# Patient Record
Sex: Male | Born: 1980 | Hispanic: No | Marital: Married | Smoking: Never smoker
Health system: Southern US, Community
[De-identification: ages and names within clinical notes are randomized; demographics above are authoritative.]

## PROBLEM LIST (undated history)

## (undated) HISTORY — PX: TONSILLECTOMY: SUR1361

---

## 2014-09-02 ENCOUNTER — Emergency Department (HOSPITAL_COMMUNITY)
Admission: EM | Admit: 2014-09-02 | Discharge: 2014-09-03 | Disposition: A | Discharge status: Home / Self Care | Payer: Self-pay | Attending: Emergency Medicine | Admitting: Emergency Medicine

## 2014-09-02 ENCOUNTER — Encounter (HOSPITAL_COMMUNITY): Payer: Self-pay | Admitting: Emergency Medicine

## 2014-09-02 DIAGNOSIS — R52 Pain, unspecified: Secondary | ICD-10-CM | POA: Insufficient documentation

## 2014-09-02 DIAGNOSIS — M542 Cervicalgia: Secondary | ICD-10-CM | POA: Insufficient documentation

## 2014-09-02 DIAGNOSIS — G8929 Other chronic pain: Secondary | ICD-10-CM | POA: Insufficient documentation

## 2014-09-02 DIAGNOSIS — Z87828 Personal history of other (healed) physical injury and trauma: Secondary | ICD-10-CM | POA: Insufficient documentation

## 2014-09-02 NOTE — ED Notes (Signed)
Pt states he just moved here recently and today he was moving boxes and felt a pop in his neck  Pt states he took a muscle relaxant and a cold shower but it did not help  Pt states it hurts to move

## 2014-09-03 ENCOUNTER — Emergency Department (HOSPITAL_COMMUNITY): Payer: Self-pay

## 2014-09-03 MED ORDER — KETOROLAC TROMETHAMINE 60 MG/2ML IM SOLN
60.0000 mg | Freq: Once | INTRAMUSCULAR | Status: AC
  Administered 2014-09-03: 60 mg via INTRAMUSCULAR
  Filled 2014-09-03: qty 2

## 2014-09-03 MED ORDER — OXYCODONE-ACETAMINOPHEN 5-325 MG PO TABS
2.0000 | ORAL_TABLET | Freq: Once | ORAL | Status: DC
  Filled 2014-09-03: qty 2

## 2014-09-03 MED ORDER — OXYCODONE-ACETAMINOPHEN 5-325 MG PO TABS: 1.0000 | ORAL_TABLET | ORAL | Status: AC | PRN

## 2014-09-03 MED ORDER — NAPROXEN 500 MG PO TABS: 500.0000 mg | ORAL_TABLET | Freq: Two times a day (BID) | ORAL | Status: AC
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
## 2014-09-03 NOTE — Discharge Instructions (Signed)
Pain medications as prescribed. Follow up with neurosurgery or primary care doctor for further treatment. Return if any numbness or weakness in extremities.   Herniated Disk A herniated disk occurs when a disk in your spine bulges out too far. This condition is also called a ruptured disk or slipped disk. Your spine (backbone) is made up of bones called vertebrae. Between each pair of vertebrae is an oval disk with a soft, spongy center that acts as a shock absorber when you move. The spongy center is surrounded by a tough outer ring. When you have a herniated disk, the spongy center of the disk bulges out or ruptures through the outer ring. A herniated disk can press on a nerve between your vertebrae and cause pain. A herniated disk can occur anywhere in your back or neck area, but the lower back is the most common spot. CAUSES  In many cases, a herniated disk occurs just from getting older. As you age, the spongy insides of your disks tend to shrink and dry out. A herniated disk can result from gradual wear and tear. Injury or sudden strain can also cause a herniated disk.  RISK FACTORS Aging is the main risk factor for a herniated disk. Other risk factors include:  Being a man between the ages of 22 and 69 years.  Having a job that requires heavy lifting, bending, or twisting.  Having a job that requires long hours of driving.  Not getting enough exercise.  Being overweight.  Smoking. SIGNS AND SYMPTOMS  Signs and symptoms depend on which disk is herniated.  For a herniated disk in the lower back, you may have sharp pain in:  One part of your leg, hip, or buttocks.  The back of your calf.  The top or sole of your foot (sciatica).   For a herniated disk in the neck, you may feel pain:  When you move your neck.  Near or over your shoulder blade.  That moves to your upper arm, forearm, or fingers.   You may also have muscle weakness. It may be hard to:  Lift your leg or  arm.  Stand on your toes.  Squeeze tightly with one of your hands.  Other symptoms can include:  Numbness or tingling in the affected areas of your body.  Loss of bladder or bowel control. This is a rare but serious sign of a severe herniated disk in the lower back. DIAGNOSIS  Your health care provider will do a physical exam. During this exam, you may have to move certain body parts or assume various positions. For example, your health care provider may do the straight-leg test. This is a good way to test for a herniated disk in your lower back. In this test, the health care provider lifts your leg while you lie on your back. This is to see if you feel pain down your leg. Your health care provider will also check for numbness or loss of feeling.  Your health care provider will also check your:  Reflexes.  Muscle strength.  Posture.  Other tests may be done to help in making a diagnosis. These may include:  An X-ray of the spine to rule out other causes of back pain.   Other imaging studies, such as an MRI or CT scan. This is to check whether the herniated disk is pressing on your spinal canal.  Electromyography (EMG). This test checks the nerves that control muscles. It is sometimes used to identify the specific  area of nerve involvement.  TREATMENT  In many cases, herniated disk symptoms go away over a period of days or weeks. You will most likely be free of symptoms in 3-4 months. Treatment may include the following:  The initial treatment for a herniated disk is ashort period of rest.  Bed rest is often limited to 1 or 2 days. Resting for too long delays recovery.  If you have a herniated disk in your lower back, you should avoid sitting as much as possible because sitting increases pressure on the disk.  Medicines. These may include:   Nonsteroidal anti-inflammatory drugs (NSAIDs).  Muscle relaxants for back spasms.  Narcotic pain medicine if your pain is very  bad.   Steroid injections. You may need these along the involved nerve root to help control pain. The steroid is injected in the area of the herniated disk. It helps by reducing swelling around the disk.  Physical therapy. This may include exercises to strengthen the muscles that help support your spine.   You may need surgery if other treatments do not work.  HOME CARE INSTRUCTIONS Follow all your health care provider's instructions. These may include:  Take all medicines as directed by your health care provider.  Rest for 2 days and then start moving.  Do not sit or stand for long periods of time.  Maintain good posture when sitting and standing.  Avoid movements that cause pain, such as bending or lifting.  When you are able to start lifting things again:  [place] with your knees.  Keep your back straight.  Hold heavy objects close to your body.  If you are overweight, ask your health care provider to help you start a weight-loss program.  When you are able to start exercising, ask your health care provider how much and what type of exercise is best for you.  Work with a physical therapist on stretching and strengthening exercises for your back.  Do not wear high-heeled shoes.  Do not sleep on your belly.  Do not smoke.  Keep all follow-up visits as directed by your health care provider. SEEK MEDICAL CARE IF:  You have back or neck pain that is not getting better after 4 weeks.  You have very bad pain in your back or neck.  You develop numbness, tingling, or weakness along with pain. SEEK IMMEDIATE MEDICAL CARE IF:   You have numbness, tingling, or weakness that makes you unable to use your arms or legs.  You lose control of your bladder or bowels.  You have dizziness or fainting.  You have shortness of breath.  MAKE SURE YOU:   Understand these instructions.  Will watch your condition.  Will get help right away if you are not doing well or get  worse. Document Released: 11/24/2000 Document Revised: 04/13/2014 Document Reviewed: 10/31/2013 [facility] Patient Information 2015 [place], [place]. This information is not intended to replace advice given to you by your health care provider. Make sure you discuss any questions you have with your health care provider.

## 2014-09-03 NOTE — ED Provider Notes (Signed)
Medical screening examination/treatment/procedure(s) were performed by non-physician practitioner and as supervising physician I was immediately available for consultation/collaboration.   EKG Interpretation None        Purvis Sheffield, MD 09/03/14 [phone]

## 2016-04-02 IMAGING — CR DG CERVICAL SPINE COMPLETE 4+V
5 series · 5 of 5 positions shown · non-contrast
Comparison: None.

CLINICAL DATA: Disc herniations several years ago, injured neck on
09/02/2014 again with pain and stiffness, pain on right side
radiating into right shoulder

EXAM:
CERVICAL SPINE  4+ VIEWS

[w cervical spine lat]
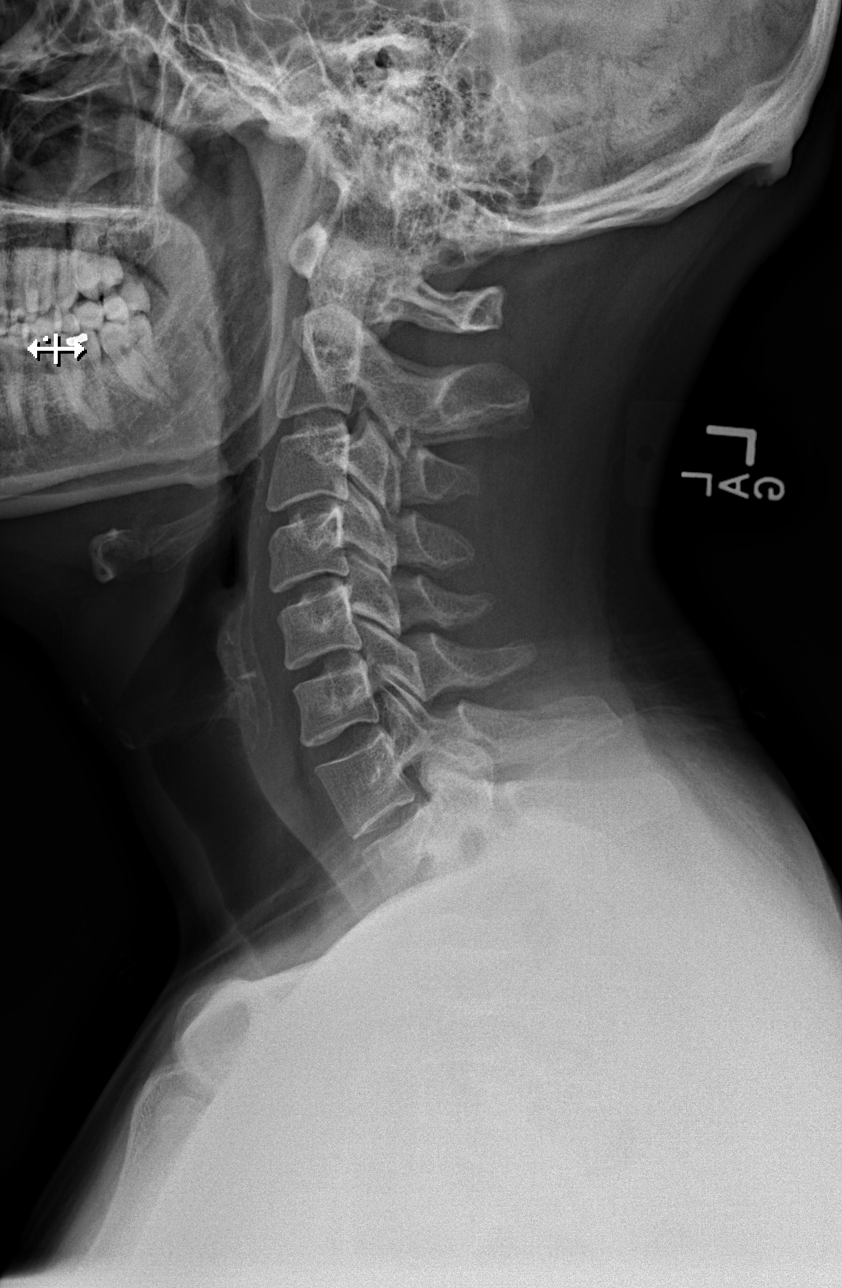

[w cervical spine ap_obl (1 of 2)]
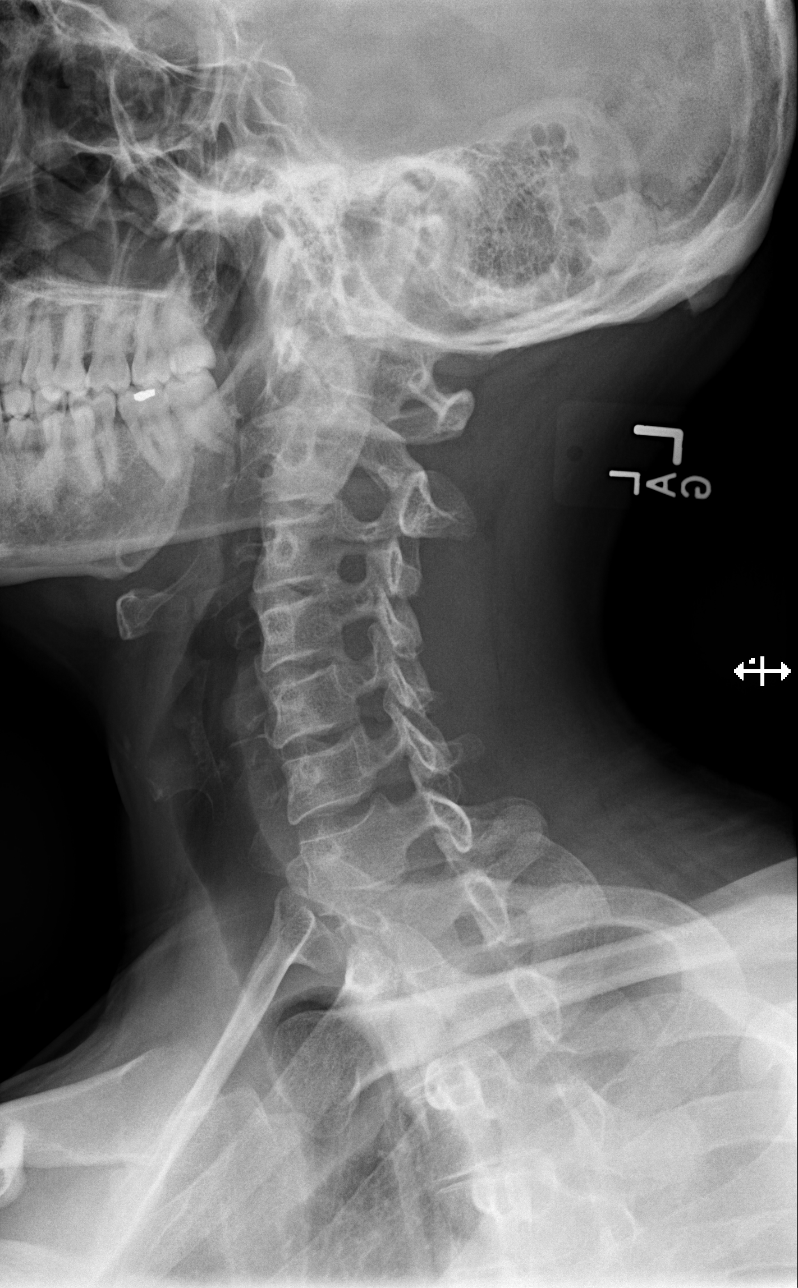

[w cervical spine ap_obl (2 of 2)]
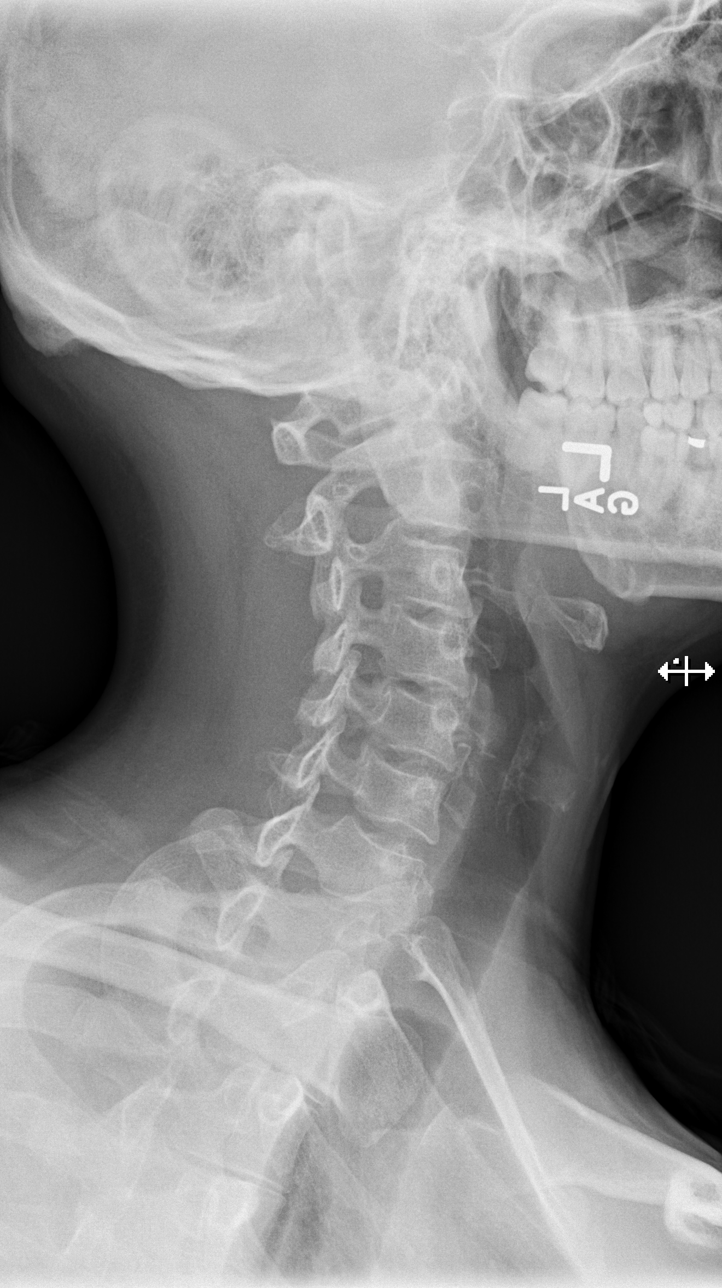

[w cervical spine ap]
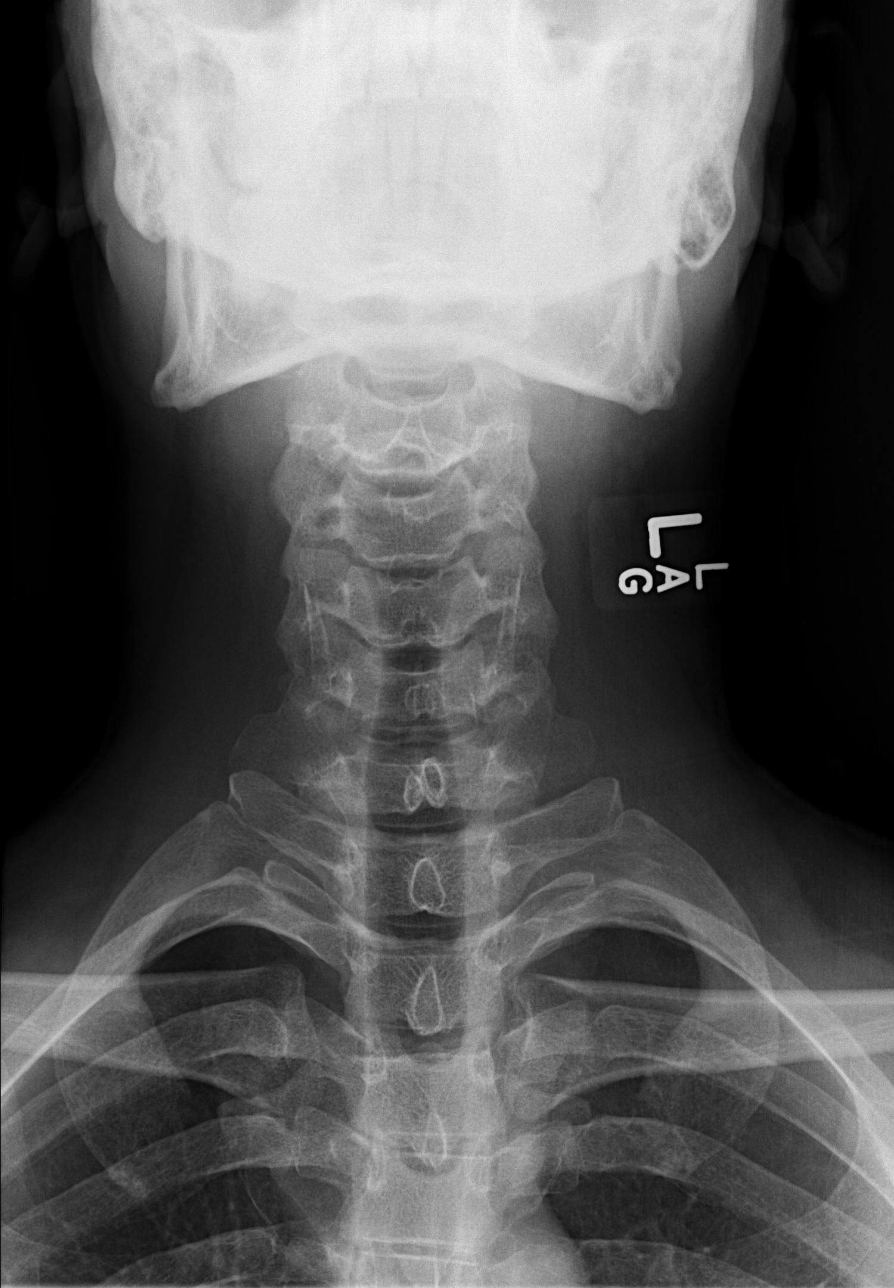

[w cervical spine odontoid]
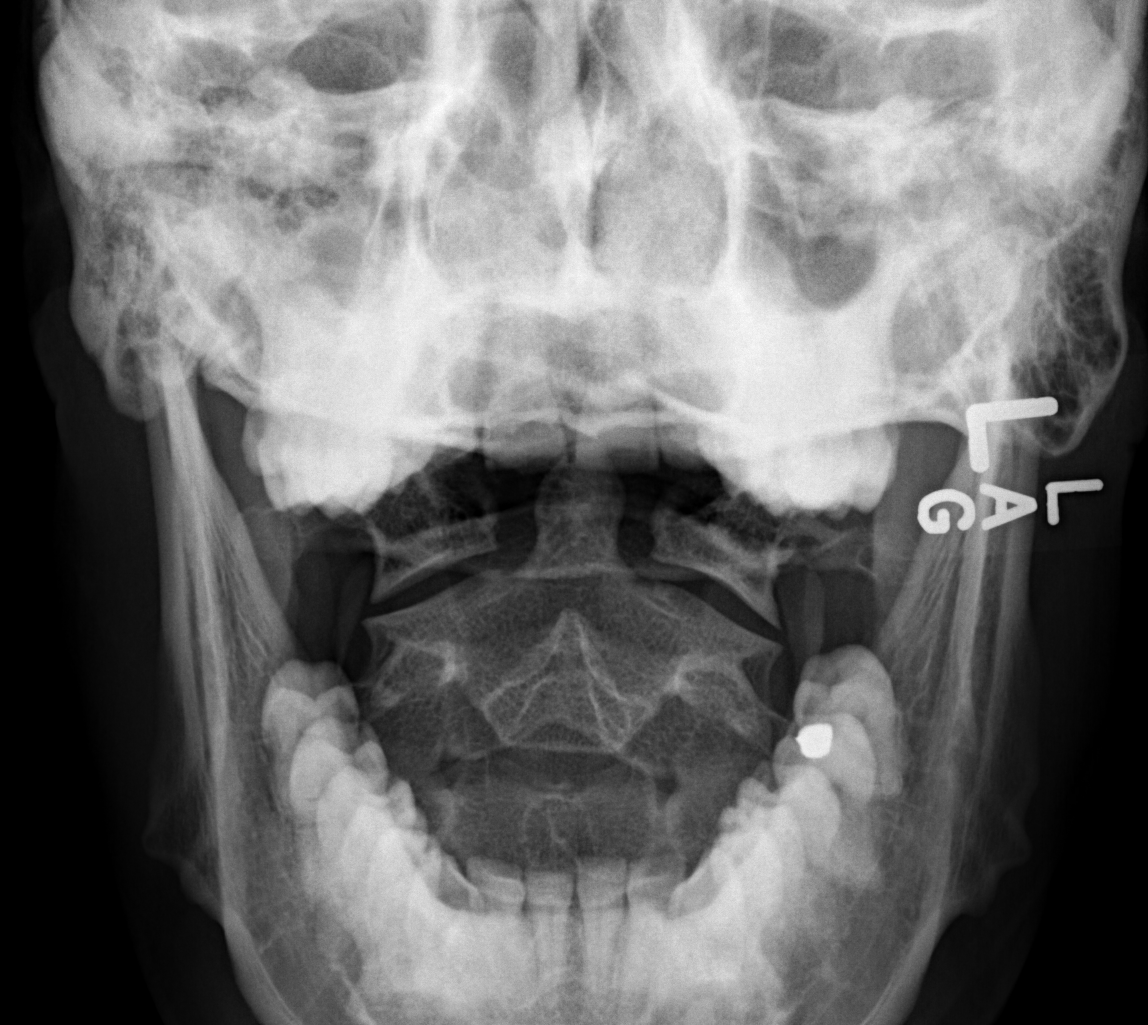

[5 of 5 positions shown; findings below may reference images not displayed]

FINDINGS: There is no evidence of cervical spine fracture or prevertebral soft
tissue swelling. Alignment is normal. No fracture. No significant
degenerative disc disease. Incidental note of mildly expanded
superior articular facets at T1. Mild left-sided foraminal narrowing
at C4-5 and C5-6.
IMPRESSION: No acute findings
